# Patient Record
Sex: Female | Born: 1990 | Race: White | Hispanic: No | Marital: Married | Smoking: Never smoker
Health system: Southern US, Community
[De-identification: ages and names within clinical notes are randomized; demographics above are authoritative.]

---
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
## 2009-12-21 ENCOUNTER — Inpatient Hospital Stay (HOSPITAL_COMMUNITY): Admission: AD | Admit: 2009-12-21 | Discharge: 2009-12-26 | Payer: Self-pay | Admitting: Obstetrics and Gynecology

## 2009-12-23 ENCOUNTER — Encounter (INDEPENDENT_AMBULATORY_CARE_PROVIDER_SITE_OTHER): Payer: Self-pay | Admitting: Obstetrics and Gynecology

## 2010-09-04 LAB — COMPREHENSIVE METABOLIC PANEL
ALT: 32 U/L (ref 0–35)
AST: 38 U/L — ABNORMAL HIGH (ref 0–37)
AST: 38 U/L — ABNORMAL HIGH (ref 0–37)
AST: 46 U/L — ABNORMAL HIGH (ref 0–37)
Albumin: 2 g/dL — ABNORMAL LOW (ref 3.5–5.2)
Albumin: 2.5 g/dL — ABNORMAL LOW (ref 3.5–5.2)
Alkaline Phosphatase: 205 U/L — ABNORMAL HIGH (ref 39–117)
Alkaline Phosphatase: 249 U/L — ABNORMAL HIGH (ref 39–117)
Alkaline Phosphatase: 355 U/L — ABNORMAL HIGH (ref 39–117)
BUN: 5 mg/dL — ABNORMAL LOW (ref 6–23)
BUN: 6 mg/dL (ref 6–23)
CO2: 25 mEq/L (ref 19–32)
CO2: 25 mEq/L (ref 19–32)
Calcium: 8.5 mg/dL (ref 8.4–10.5)
Calcium: 9.1 mg/dL (ref 8.4–10.5)
Calcium: 9.5 mg/dL (ref 8.4–10.5)
Chloride: 104 mEq/L (ref 96–112)
Creatinine, Ser: 0.59 mg/dL (ref 0.4–1.2)
GFR calc Af Amer: >60 mL/min (ref >60)
GFR calc Af Amer: >60 mL/min (ref >60)
GFR calc Af Amer: >60 mL/min (ref >60)
GFR calc non Af Amer: >60 mL/min (ref >60)
GFR calc non Af Amer: >60 mL/min (ref >60)
Glucose, Bld: 131 mg/dL — ABNORMAL HIGH (ref 70–99)
Glucose, Bld: 84 mg/dL (ref 70–99)
Glucose, Bld: 90 mg/dL (ref 70–99)
Potassium: 3.5 mEq/L (ref 3.5–5.1)
Potassium: 3.6 mEq/L (ref 3.5–5.1)
Potassium: 3.7 mEq/L (ref 3.5–5.1)
Sodium: 134 mEq/L — ABNORMAL LOW (ref 135–145)
Sodium: 136 mEq/L (ref 135–145)
Total Bilirubin: 0.6 mg/dL (ref 0.3–1.2)
Total Bilirubin: 1.1 mg/dL (ref 0.3–1.2)
Total Protein: 4.4 g/dL — ABNORMAL LOW (ref 6.0–8.3)
Total Protein: 4.9 g/dL — ABNORMAL LOW (ref 6.0–8.3)
Total Protein: 6.6 g/dL (ref 6.0–8.3)

## 2010-09-04 LAB — CBC
HCT: 31.5 % — ABNORMAL LOW (ref 36.0–46.0)
HCT: 34.3 % — ABNORMAL LOW (ref 36.0–46.0)
HCT: 35.8 % — ABNORMAL LOW (ref 36.0–46.0)
HCT: 38.6 % (ref 36.0–46.0)
Hemoglobin: 11.8 g/dL — ABNORMAL LOW (ref 12.0–15.0)
Hemoglobin: 12.4 g/dL (ref 12.0–15.0)
Hemoglobin: 12.9 g/dL (ref 12.0–15.0)
MCH: 31.9 pg (ref 26.0–34.0)
MCH: 32 pg (ref 26.0–34.0)
MCH: 32.3 pg (ref 26.0–34.0)
MCHC: 33.8 g/dL (ref 30.0–36.0)
MCHC: 34.5 g/dL (ref 30.0–36.0)
MCHC: 34.5 g/dL (ref 30.0–36.0)
MCHC: 34.5 g/dL (ref 30.0–36.0)
MCV: 92.6 fL (ref 78.0–100.0)
MCV: 93.2 fL (ref 78.0–100.0)
MCV: 93.8 fL (ref 78.0–100.0)
Platelets: 105 10*3/uL — ABNORMAL LOW (ref 150–400)
Platelets: 108 10*3/uL — ABNORMAL LOW (ref 150–400)
Platelets: 127 10*3/uL — ABNORMAL LOW (ref 150–400)
Platelets: 138 10*3/uL — ABNORMAL LOW (ref 150–400)
RBC: 4.29 MIL/uL (ref 3.87–5.11)
RBC: 4.39 MIL/uL (ref 3.87–5.11)
RDW: 13.4 % (ref 11.5–15.5)
RDW: 13.6 % (ref 11.5–15.5)
RDW: 14.1 % (ref 11.5–15.5)
WBC: 12.6 10*3/uL — ABNORMAL HIGH (ref 4.0–10.5)
WBC: 15.4 10*3/uL — ABNORMAL HIGH (ref 4.0–10.5)
WBC: 9.1 10*3/uL (ref 4.0–10.5)

## 2010-09-04 LAB — URINALYSIS, ROUTINE W REFLEX MICROSCOPIC
Nitrite: NEGATIVE
Specific Gravity, Urine: <1.005 — ABNORMAL LOW (ref 1.005–1.030)
Urobilinogen, UA: 0.2 mg/dL (ref 0.0–1.0)

## 2010-09-04 LAB — LACTATE DEHYDROGENASE
LDH: 132 U/L (ref 94–250)
LDH: 142 U/L (ref 94–250)

## 2010-09-04 LAB — URIC ACID: Uric Acid, Serum: 5.8 mg/dL (ref 2.4–7.0)

## 2010-09-04 LAB — URINE MICROSCOPIC-ADD ON

## 2010-09-04 LAB — MAGNESIUM: Magnesium: 5.6 mg/dL — ABNORMAL HIGH (ref 1.5–2.5)

## 2018-01-08 ENCOUNTER — Emergency Department (HOSPITAL_COMMUNITY)
Admission: EM | Admit: 2018-01-08 | Discharge: 2018-01-08 | Disposition: A | Discharge status: Home / Self Care | Attending: Emergency Medicine | Admitting: Emergency Medicine

## 2018-01-08 ENCOUNTER — Encounter (HOSPITAL_COMMUNITY): Payer: Self-pay | Admitting: Emergency Medicine

## 2018-01-08 ENCOUNTER — Emergency Department (HOSPITAL_COMMUNITY)

## 2018-01-08 DIAGNOSIS — M542 Cervicalgia: Secondary | ICD-10-CM | POA: Insufficient documentation

## 2018-01-08 DIAGNOSIS — S63502A Unspecified sprain of left wrist, initial encounter: Secondary | ICD-10-CM | POA: Insufficient documentation

## 2018-01-08 DIAGNOSIS — Y9389 Activity, other specified: Secondary | ICD-10-CM | POA: Insufficient documentation

## 2018-01-08 DIAGNOSIS — S6992XA Unspecified injury of left wrist, hand and finger(s), initial encounter: Secondary | ICD-10-CM | POA: Diagnosis present

## 2018-01-08 DIAGNOSIS — Y92410 Unspecified street and highway as the place of occurrence of the external cause: Secondary | ICD-10-CM | POA: Insufficient documentation

## 2018-01-08 DIAGNOSIS — Y998 Other external cause status: Secondary | ICD-10-CM | POA: Insufficient documentation

## 2018-01-08 DIAGNOSIS — S8001XA Contusion of right knee, initial encounter: Secondary | ICD-10-CM | POA: Insufficient documentation

## 2018-01-08 MED ORDER — ACETAMINOPHEN 500 MG PO TABS
1000.0000 mg | ORAL_TABLET | Freq: Once | ORAL | Status: AC
  Administered 2018-01-08: 1000 mg via ORAL
  Filled 2018-01-08: qty 2

## 2018-01-08 MED ORDER — METHOCARBAMOL 500 MG PO TABS
500.0000 mg | ORAL_TABLET | Freq: Once | ORAL | Status: AC
  Administered 2018-01-08: 500 mg via ORAL
  Filled 2018-01-08: qty 1

## 2018-01-08 MED ORDER — METHOCARBAMOL 500 MG PO TABS: 500.0000 mg | ORAL_TABLET | Freq: Two times a day (BID) | ORAL | 0 refills | Status: AC
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
